# Patient Record
Sex: Male | Born: 1955
Health system: Southern US, Community
[De-identification: ages and names within clinical notes are randomized; demographics above are authoritative.]

## PROBLEM LIST (undated history)

## (undated) DIAGNOSIS — H9319 Tinnitus, unspecified ear: Secondary | ICD-10-CM

## (undated) DIAGNOSIS — K759 Inflammatory liver disease, unspecified: Secondary | ICD-10-CM

## (undated) DIAGNOSIS — C801 Malignant (primary) neoplasm, unspecified: Secondary | ICD-10-CM

## (undated) HISTORY — DX: Tinnitus, unspecified ear: H93.19

## (undated) HISTORY — DX: Malignant (primary) neoplasm, unspecified: C80.1

## (undated) HISTORY — DX: Inflammatory liver disease, unspecified: K75.9

---
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
## 2006-11-23 HISTORY — PX: HERNIA REPAIR: SHX51

## 2009-11-23 HISTORY — PX: COLONOSCOPY: SHX174

## 2013-08-28 ENCOUNTER — Other Ambulatory Visit: Payer: Self-pay | Admitting: *Deleted

## 2013-08-28 DIAGNOSIS — R944 Abnormal results of kidney function studies: Secondary | ICD-10-CM

## 2013-09-05 ENCOUNTER — Ambulatory Visit
Admission: RE | Admit: 2013-09-05 | Discharge: 2013-09-05 | Disposition: A | Discharge status: Home / Self Care | Payer: 59 | Source: Ambulatory Visit | Attending: *Deleted | Admitting: *Deleted

## 2013-09-05 DIAGNOSIS — R944 Abnormal results of kidney function studies: Secondary | ICD-10-CM

## 2014-07-31 DIAGNOSIS — M19039 Primary osteoarthritis, unspecified wrist: Secondary | ICD-10-CM | POA: Insufficient documentation

## 2014-07-31 DIAGNOSIS — K759 Inflammatory liver disease, unspecified: Secondary | ICD-10-CM | POA: Insufficient documentation

## 2014-07-31 DIAGNOSIS — H905 Unspecified sensorineural hearing loss: Secondary | ICD-10-CM | POA: Insufficient documentation

## 2014-07-31 DIAGNOSIS — H9313 Tinnitus, bilateral: Secondary | ICD-10-CM | POA: Insufficient documentation

## 2014-10-02 ENCOUNTER — Ambulatory Visit: Payer: Self-pay | Admitting: Internal Medicine

## 2014-10-17 ENCOUNTER — Ambulatory Visit (INDEPENDENT_AMBULATORY_CARE_PROVIDER_SITE_OTHER): Payer: PRIVATE HEALTH INSURANCE | Admitting: Internal Medicine

## 2014-10-17 ENCOUNTER — Encounter: Payer: Self-pay | Admitting: Internal Medicine

## 2014-10-17 VITALS — BP 124/76 | HR 66 | Temp 98.3°F | Ht 73.0 in | Wt 180.0 lb

## 2014-10-17 DIAGNOSIS — Z0000 Encounter for general adult medical examination without abnormal findings: Secondary | ICD-10-CM | POA: Insufficient documentation

## 2014-10-17 DIAGNOSIS — Z125 Encounter for screening for malignant neoplasm of prostate: Secondary | ICD-10-CM

## 2014-10-17 LAB — BASIC METABOLIC PANEL
BUN: 20 mg/dL (ref 6–23)
CO2: 26 mEq/L (ref 19–32)
Calcium: 10.1 mg/dL (ref 8.4–10.5)
Chloride: 104 mEq/L (ref 96–112)
Creat: 1.15 mg/dL (ref 0.50–1.35)
Glucose, Bld: 85 mg/dL (ref 70–99)
Potassium: 4.3 mEq/L (ref 3.5–5.3)
Sodium: 140 mEq/L (ref 135–145)

## 2014-10-17 LAB — CBC WITH DIFFERENTIAL/PLATELET
Basophils Absolute: 0.1 10*3/uL (ref 0.0–0.1)
Basophils Relative: 1 % (ref 0–1)
Eosinophils Absolute: 0.2 10*3/uL (ref 0.0–0.7)
Eosinophils Relative: 3 % (ref 0–5)
HCT: 42.5 % (ref 39.0–52.0)
Hemoglobin: 14.3 g/dL (ref 13.0–17.0)
Lymphocytes Relative: 41 % (ref 12–46)
Lymphs Abs: 2.9 10*3/uL (ref 0.7–4.0)
MCH: 31.4 pg (ref 26.0–34.0)
MCHC: 33.6 g/dL (ref 30.0–36.0)
MCV: 93.2 fL (ref 78.0–100.0)
MPV: 9.9 fL (ref 9.4–12.4)
Monocytes Absolute: 0.6 10*3/uL (ref 0.1–1.0)
Monocytes Relative: 8 % (ref 3–12)
Neutro Abs: 3.3 10*3/uL (ref 1.7–7.7)
Neutrophils Relative %: 47 % (ref 43–77)
Platelets: 204 10*3/uL (ref 150–400)
RBC: 4.56 MIL/uL (ref 4.22–5.81)
RDW: 13.2 % (ref 11.5–15.5)
WBC: 7.1 10*3/uL (ref 4.0–10.5)

## 2014-10-17 LAB — LIPID PANEL
Cholesterol: 163 mg/dL (ref 0–200)
HDL: 52 mg/dL (ref >39)
LDL Cholesterol: 90 mg/dL (ref 0–99)
Total CHOL/HDL Ratio: 3.1 Ratio
Triglycerides: 107 mg/dL (ref <150)
VLDL: 21 mg/dL (ref 0–40)

## 2014-10-17 LAB — HEPATIC FUNCTION PANEL
ALT: 27 U/L (ref 0–53)
AST: 27 U/L (ref 0–37)
Albumin: 4.6 g/dL (ref 3.5–5.2)
Alkaline Phosphatase: 65 U/L (ref 39–117)
Bilirubin, Direct: 0.1 mg/dL (ref 0.0–0.3)
Indirect Bilirubin: 0.3 mg/dL (ref 0.2–1.2)
Total Bilirubin: 0.4 mg/dL (ref 0.2–1.2)
Total Protein: 7.3 g/dL (ref 6.0–8.3)

## 2014-10-17 LAB — TSH: TSH: 1.569 u[IU]/mL (ref 0.350–4.500)

## 2014-10-17 NOTE — Assessment & Plan Note (Signed)
Reviewed adult health maintenance protocols.  Patient completed colonoscopy in 2011. Obtain copy of colonoscopy.  Patient has family history of metastatic prostate cancer. His father was diagnosed at age 60. His digital rectal exam was unremarkable. Monitor PSA.    He also has family history of coronary artery disease.  Obtain screening blood work.  He exercises on a regular basis.  He declines influenza vaccine.

## 2014-10-17 NOTE — Progress Notes (Signed)
Subjective:    Patient ID: Richard Mosley, male    DOB: Aug 24, 1956, 58 y.o.   MRN: [id number]  HPI  58 year old white male presents for routine new patient physical.  Patient previously followed by [facility].  Patient denies history of chronic illness. Patient reports remote history of transient hepatitis after receiving blood transfusion 1957. Patient suffered traumatic injury as a child which resulted in liver laceration.  His father diagnosed with prostate cancer (metastatic) at age 65. Patient reports he has been screened for prostate cancer yearly. His previous PSA normals reported normal.  Last PSA completed in 08/24/2013-1.290.  Patient reports he completed a colonoscopy in 2011. It was unremarkable. Follow-up colonoscopy recommended in 10 years.   Review of Systems  Constitutional: Negative for activity change, appetite change and unexpected weight change.  Eyes: Negative for visual disturbance.  Respiratory: Negative for cough, chest tightness and shortness of breath.   Cardiovascular: Negative for chest pain.  Genitourinary: Negative for difficulty urinating.  Neurological: Negative for headaches.  Gastrointestinal: Negative for abdominal pain, heartburn melena or hematochezia Psych: Negative for depression or anxiety Skin:  He was last evaluated by dermatologist 3 years ago        Past Medical History  Diagnosis Date  . Cancer     skin cancer (basal cell)  . Hepatitis     Transient - after blood transfusion  . Tinnitus     History   Social History  . Marital Status: Married    Spouse Name: N/A    Number of Children: N/A  . Years of Education: N/A   Occupational History  . 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 Smoking status: Former Smoker -- 0.50 packs/day for 10 years    Types: Cigars, Cigarettes  . Smokeless tobacco: Never Used     Comment: 1 cigar per week  . Alcohol Use: 4.2 oz/week    7 Cans of beer per week       Comment: daily  . Drug Use: No  . Sexual Activity: Not on file   Other Topics Concern  . Not on file   Social History Narrative   Married (2nd marriage)   1 step daughter   Former smoker   Quit smoking 30 years ago   Smoked half pack per day for 10 years   Current - occasionally smokes cigars    Past Surgical History  Procedure Laterality Date  . Hernia repair Bilateral 2008  . Liver laceration  1957  . Colonoscopy  2011    Normal exam    Family History  Problem Relation Age of Onset  . Stroke Mother   . Hypertension Mother   . Hypertension Father   . Heart disease Father   . Cancer Father 16    Metastatic prostate cancer   . Heart disease Brother     both brothers    Not on File  No current outpatient prescriptions on file prior to visit.   No current facility-administered medications on file prior to visit.    BP 124/76 mmHg  Pulse 66  Temp(Src) 98.3 F (36.8 C) (Oral)  Ht 6\' 1"  (1.854 m)  Wt 180 lb (81.647 kg)  BMI 23.75 kg/m2        Objective:   Physical Exam  Constitutional: He is oriented to person, place, and time. He appears well-developed and well-nourished. No distress.  HENT:  Head: Normocephalic and atraumatic.  Right Ear:  External ear normal.  Left Ear: External ear normal.  Mouth/Throat: Oropharynx is clear and moist.  Eyes: Conjunctivae and EOM are normal. Pupils are equal, round, and reactive to light.  Neck: Neck supple.  Cardiovascular: Normal rate, regular rhythm and normal heart sounds.   No murmur heard. slightly diminished pedis dorsalis pulse in right foot  Pulmonary/Chest: Effort normal and breath sounds normal. He has no wheezes.  Abdominal: Soft. Bowel sounds are normal. He exhibits no mass. There is no tenderness.  Genitourinary: Prostate normal and penis normal. Guaiac negative stool.  External hemorrhoids  Musculoskeletal: He exhibits no edema.  Lymphadenopathy:    He has no cervical adenopathy.   Neurological: He is alert and oriented to person, place, and time. No cranial nerve deficit. Coordination normal.  Psychiatric: He has a normal mood and affect. His behavior is normal.          Assessment & Plan:

## 2014-10-18 LAB — HIGH SENSITIVITY CRP: CRP, High Sensitivity: 0.2 mg/L

## 2014-10-18 LAB — PSA: PSA: 2.02 ng/mL (ref <=4.00)

## 2014-10-19 ENCOUNTER — Encounter: Payer: Self-pay | Admitting: Internal Medicine

## 2014-11-02 ENCOUNTER — Encounter: Payer: Self-pay | Admitting: Internal Medicine

## 2014-11-12 ENCOUNTER — Ambulatory Visit: Payer: Self-pay | Admitting: Internal Medicine

## 2014-12-05 ENCOUNTER — Ambulatory Visit: Payer: Self-pay | Admitting: Internal Medicine

## 2018-07-11 ENCOUNTER — Ambulatory Visit
Admission: RE | Admit: 2018-07-11 | Discharge: 2018-07-11 | Disposition: A | Discharge status: Home / Self Care | Payer: BLUE CROSS/BLUE SHIELD | Source: Ambulatory Visit | Attending: Internal Medicine | Admitting: Internal Medicine

## 2018-07-11 ENCOUNTER — Other Ambulatory Visit: Payer: Self-pay | Admitting: Internal Medicine

## 2018-07-11 DIAGNOSIS — M79671 Pain in right foot: Secondary | ICD-10-CM

## 2018-08-26 ENCOUNTER — Ambulatory Visit: Payer: BLUE CROSS/BLUE SHIELD | Admitting: Podiatry

## 2018-08-26 ENCOUNTER — Encounter

## 2019-11-27 DIAGNOSIS — R69 Illness, unspecified: Secondary | ICD-10-CM | POA: Diagnosis not present

## 2019-12-18 DIAGNOSIS — C44712 Basal cell carcinoma of skin of right lower limb, including hip: Secondary | ICD-10-CM | POA: Diagnosis not present

## 2020-01-22 DIAGNOSIS — R202 Paresthesia of skin: Secondary | ICD-10-CM | POA: Diagnosis not present

## 2020-01-26 ENCOUNTER — Ambulatory Visit: Payer: PRIVATE HEALTH INSURANCE | Admitting: Podiatry

## 2020-02-13 ENCOUNTER — Ambulatory Visit (INDEPENDENT_AMBULATORY_CARE_PROVIDER_SITE_OTHER): Payer: Medicare HMO | Admitting: Podiatry

## 2020-02-13 ENCOUNTER — Ambulatory Visit (INDEPENDENT_AMBULATORY_CARE_PROVIDER_SITE_OTHER): Payer: Medicare HMO

## 2020-02-13 ENCOUNTER — Other Ambulatory Visit: Payer: Self-pay

## 2020-02-13 ENCOUNTER — Encounter: Payer: Self-pay | Admitting: Podiatry

## 2020-02-13 VITALS — BP 141/79 | HR 64 | Temp 97.1°F

## 2020-02-13 DIAGNOSIS — M778 Other enthesopathies, not elsewhere classified: Secondary | ICD-10-CM

## 2020-02-13 DIAGNOSIS — M7742 Metatarsalgia, left foot: Secondary | ICD-10-CM

## 2020-02-13 DIAGNOSIS — M7741 Metatarsalgia, right foot: Secondary | ICD-10-CM | POA: Diagnosis not present

## 2020-02-13 MED ORDER — MELOXICAM 15 MG PO TABS: 15.0000 mg | ORAL_TABLET | Freq: Every day | ORAL | 3 refills | Status: AC

## 2020-02-13 NOTE — Progress Notes (Signed)
Subjective:  Patient ID: Richard Mosley, male    DOB: June 30, 1956,  MRN: [id number] HPI Chief Complaint  Patient presents with  . Foot Pain    Plantar forefoot bilateral - feels like walking on "bumps", [place] trip and golfed-noticed it worsened, also work PT jobs standing the whole time, tried new shoes  . New Patient (Initial Visit)    64 y.o. male presents with the above complaint.   ROS: Denies fever chills nausea vomiting muscle aches pains calf pain back pain chest pain shortness of breath.  Past Medical History:  Diagnosis Date  . Cancer ([place])    skin cancer (basal cell)  . Hepatitis    Transient - after blood transfusion  . Tinnitus    Past Surgical History:  Procedure Laterality Date  . COLONOSCOPY  2011   Normal exam  . HERNIA REPAIR Bilateral 2008  . Liver laceration  1957    Current Outpatient Medications:  .  SILDENAFIL CITRATE PO, Take by mouth., Disp: , Rfl:  .  meloxicam (MOBIC) 15 MG tablet, Take 1 tablet (15 mg total) by mouth daily., Disp: 30 tablet, Rfl: 3 .  Multiple Vitamin (MULTIVITAMIN) tablet, Take 1 tablet by mouth daily., Disp: , Rfl:  .  Omega-3 Fatty Acids (FISH OIL) 1000 MG CAPS, Take 1 capsule by mouth daily., Disp: , Rfl:  .  saw palmetto 160 MG capsule, Take 160 mg by mouth daily., Disp: , Rfl:  .  vitamin C (ASCORBIC ACID) 500 MG tablet, Take 500 mg by mouth daily., Disp: , Rfl:   No Known Allergies Review of Systems Objective:   Vitals:   02/13/20 0838  BP: (!) 141/79  Pulse: 64  Temp: (!) 97.1 F (36.2 C)    General: Well developed, nourished, in no acute distress, alert and oriented x3   Dermatological: Skin is warm, dry and supple bilateral. Nails x 10 are well maintained; remaining integument appears unremarkable at this time. There are no open sores, no preulcerative lesions, no rash or signs of infection present.  Vascular: Dorsalis Pedis artery and Posterior Tibial artery pedal pulses are 2/4 bilateral with immedate  capillary fill time. Pedal hair growth present. No varicosities and no lower extremity edema present bilateral.   Neruologic: Grossly intact via light touch bilateral. Vibratory intact via tuning fork bilateral. Protective threshold with Semmes Wienstein monofilament intact to all pedal sites bilateral. Patellar and Achilles deep tendon reflexes 2+ bilateral. No Babinski or clonus noted bilateral.   Musculoskeletal: No gross boney pedal deformities bilateral. No pain, crepitus, or limitation noted with foot and ankle range of motion bilateral. Muscular strength 5/5 in all groups tested bilateral.  Slightly elongated second and third metatarsals of the bilateral foot with mild flexible hammertoe deformities.  No significant pain on range of motion or on end range of motion some tenderness at the metatarsophalangeal joints plantarly.  Gait: Unassisted, Nonantalgic.    Radiographs:  None taken  Assessment & Plan:   Assessment: Metatarsalgia capsulitis second and third metatarsophalangeal joints bilaterally.  Cannot rule out a neuropathy or neuritis.  Plan: Discussed etiology pathology conservative versus surgical therapies at this point in time went ahead and recommended meloxicam 15 mg 1 p.o. daily I also started him on Voltaren gel over-the-counter.  We discussed appropriate stiff soled shoes and shoe gear modifications.  We did discuss the possibility of orthotics and injections.  I will follow-up with him in 1 month if he has not improved with conservative treatment.     [name]  T. [place], [place]

## 2020-02-29 DIAGNOSIS — L814 Other melanin hyperpigmentation: Secondary | ICD-10-CM | POA: Diagnosis not present

## 2020-02-29 DIAGNOSIS — L905 Scar conditions and fibrosis of skin: Secondary | ICD-10-CM | POA: Diagnosis not present

## 2020-02-29 DIAGNOSIS — D229 Melanocytic nevi, unspecified: Secondary | ICD-10-CM | POA: Diagnosis not present

## 2020-02-29 DIAGNOSIS — Z85828 Personal history of other malignant neoplasm of skin: Secondary | ICD-10-CM | POA: Diagnosis not present

## 2020-02-29 DIAGNOSIS — L819 Disorder of pigmentation, unspecified: Secondary | ICD-10-CM | POA: Diagnosis not present

## 2020-02-29 DIAGNOSIS — L821 Other seborrheic keratosis: Secondary | ICD-10-CM | POA: Diagnosis not present

## 2020-02-29 DIAGNOSIS — L570 Actinic keratosis: Secondary | ICD-10-CM | POA: Diagnosis not present

## 2020-03-21 ENCOUNTER — Ambulatory Visit: Payer: Medicare HMO | Admitting: Podiatry

## 2020-04-02 ENCOUNTER — Ambulatory Visit: Payer: Medicare HMO | Admitting: Podiatry

## 2020-04-02 ENCOUNTER — Other Ambulatory Visit: Payer: Self-pay

## 2020-04-02 DIAGNOSIS — M7742 Metatarsalgia, left foot: Secondary | ICD-10-CM | POA: Diagnosis not present

## 2020-04-02 DIAGNOSIS — M7741 Metatarsalgia, right foot: Secondary | ICD-10-CM | POA: Diagnosis not present

## 2020-04-02 NOTE — Progress Notes (Signed)
He presents today states that is been improvement from last visit though he did not purchase the Voltaren gel and the meloxicam really did not do much for him and currently he does not want to splurge on the orthotics.  He states that he has been rolling a ball under his arch and stretching his toes.  He also feels an area of uncomfortable tenderness between the third and fourth metatarsophalangeal joints right over left.  Objective: Vital signs are stable alert oriented x3 ulcers are palpable.  Neurologic sensorium is intact he does have much decrease in pain on palpation and range of motion of the metatarsophalangeal joints particularly the lessers.  He also has some tenderness on palpation of the third interdigital space right over left.  Assessment: Resolving metatarsalgia capsulitis forefoot bilateral he does have some signs of neuroma bilateral but not significant enough to do anything about them at this time so we did once again discuss appropriate shoe gear with an solid soles which should help alleviate his symptoms.

## 2020-04-12 DIAGNOSIS — Z8249 Family history of ischemic heart disease and other diseases of the circulatory system: Secondary | ICD-10-CM | POA: Diagnosis not present

## 2020-04-12 DIAGNOSIS — R030 Elevated blood-pressure reading, without diagnosis of hypertension: Secondary | ICD-10-CM | POA: Diagnosis not present

## 2020-04-12 DIAGNOSIS — Z809 Family history of malignant neoplasm, unspecified: Secondary | ICD-10-CM | POA: Diagnosis not present

## 2020-04-12 DIAGNOSIS — Z85828 Personal history of other malignant neoplasm of skin: Secondary | ICD-10-CM | POA: Diagnosis not present

## 2020-04-12 DIAGNOSIS — Z87891 Personal history of nicotine dependence: Secondary | ICD-10-CM | POA: Diagnosis not present

## 2020-05-30 DIAGNOSIS — R6884 Jaw pain: Secondary | ICD-10-CM | POA: Diagnosis not present

## 2020-05-31 ENCOUNTER — Encounter: Payer: Self-pay | Admitting: Neurology

## 2020-07-02 DIAGNOSIS — Z131 Encounter for screening for diabetes mellitus: Secondary | ICD-10-CM | POA: Diagnosis not present

## 2020-07-02 DIAGNOSIS — Z0000 Encounter for general adult medical examination without abnormal findings: Secondary | ICD-10-CM | POA: Diagnosis not present

## 2020-07-02 DIAGNOSIS — E7800 Pure hypercholesterolemia, unspecified: Secondary | ICD-10-CM | POA: Diagnosis not present

## 2020-07-02 DIAGNOSIS — Z13220 Encounter for screening for lipoid disorders: Secondary | ICD-10-CM | POA: Diagnosis not present

## 2020-07-02 DIAGNOSIS — Z125 Encounter for screening for malignant neoplasm of prostate: Secondary | ICD-10-CM | POA: Diagnosis not present

## 2020-08-26 NOTE — Progress Notes (Signed)
NEUROLOGY CONSULTATION NOTE  Richard Mosley MRN: [id number] DOB: 1956-01-12  Referring provider: Seward Carol, MD Primary care provider: Seward Carol, MD  Reason for consult:  Left sided facial pain  HISTORY OF PRESENT ILLNESS: Richard Mosley is a 64 year old right-handed male who presents for left-sided facial pain and neuropathy.  History supplemented by referring provider's notes.  Over the past 5 years, he has experienced intermittent sharp paroxymal pain on the left side of his face.  He describes a severe pain in the left nasolabial fold that radiates up to the left eye.  No associated numbness or facial weakness.  It typically occurs in the winter but was also aggravated over the summer when he was in hot and dry weather out [place].  It is often triggered by cold air or touching his cheek.   When he has it, it usually occurs for 2 to 3 hours in the mornings.  He was evaluated by both a dentist and orthodontist who found no dental etiology.  He is concerned about trigeminal neuralgia.  He is currently without pain.  In February, he developed fairly sudden onset numbness in the feet.  When he stands, it feels like he is standing on bumps.  He may feel paresthesias in the feet with prolonged standing or wearing tight shoes or socks.  No low back pain or radiating pain down the legs.  It has not progressed since February.  He does not have diabetes.  TSH was normal.   06/26/2020 LABS:  CMP with Na 140, K 4.1, Cl 105, CO2 29, glucose 90, BUN 25, Cr 1.04, t bili 0.4, ALP 61, AST 21, ALT 18; TSH 2.02  PAST MEDICAL HISTORY: Past Medical History:  Diagnosis Date  . Cancer ([place])    skin cancer (basal cell)  . Hepatitis    Transient - after blood transfusion  . Tinnitus     PAST SURGICAL HISTORY: Past Surgical History:  Procedure Laterality Date  . COLONOSCOPY  2011   Normal exam  . HERNIA REPAIR Bilateral 2008  . Liver laceration  1957    MEDICATIONS: Current Outpatient Medications  on File Prior to Visit  Medication Sig Dispense Refill  . amoxicillin (AMOXIL) 500 MG capsule Take 500 mg by mouth 3 (three) times daily.    . meloxicam (MOBIC) 15 MG tablet Take 1 tablet (15 mg total) by mouth daily. 30 tablet 3  . Multiple Vitamin (MULTIVITAMIN) tablet Take 1 tablet by mouth daily.    . Omega-3 Fatty Acids (FISH OIL) 1000 MG CAPS Take 1 capsule by mouth daily.    . saw palmetto 160 MG capsule Take 160 mg by mouth daily.    Marland Kitchen SILDENAFIL CITRATE PO Take by mouth.    . vitamin C (ASCORBIC ACID) 500 MG tablet Take 500 mg by mouth daily.     No current facility-administered medications on file prior to visit.    ALLERGIES: No Known Allergies  FAMILY HISTORY: Family History  Problem Relation Age of Onset  . Stroke Mother   . Hypertension Mother   . Hypertension Father   . Heart disease Father   . Cancer Father 81       Metastatic prostate cancer   . Heart disease Brother        both brothers   SOCIAL HISTORY: Social History   Socioeconomic History  . Marital status: Married    Spouse name: Not on file  . Number of children: Not on file  . Years  of education: Not on file  . Highest education level: Not on file  Occupational History  . Occupation: Tree surgeon    Comment: Assisted Living  Tobacco Use  . Smoking status: Former Smoker    Packs/day: 0.50    Years: 10.00    Pack years: 5.00    Types: Cigars, Cigarettes  . Smokeless tobacco: Never Used  . Tobacco comment: 1 cigar per week  Substance and Sexual Activity  . Alcohol use: Yes    Alcohol/week: 7.0 standard drinks    Types: 7 Cans of beer per week    Comment: daily  . Drug use: No  . Sexual activity: Not on file  Other Topics Concern  . Not on file  Social History Narrative   Married (2nd marriage)   1 step daughter   Former smoker   Quit smoking 30 years ago   Smoked half pack per day for 10 years   Current - occasionally smokes cigars   Social Determinants of Health   Financial  Resource Strain:   . Difficulty of Paying Living Expenses: Not on file  Food Insecurity:   . Worried About Charity fundraiser in the Last Year: Not on file  . Ran Out of Food in the Last Year: Not on file  Transportation Needs:   . Lack of Transportation (Medical): Not on file  . Lack of Transportation (Non-Medical): Not on file  Physical Activity:   . Days of Exercise per Week: Not on file  . Minutes of Exercise per Session: Not on file  Stress:   . Feeling of Stress : Not on file  Social Connections:   . Frequency of Communication with Friends and Family: Not on file  . Frequency of Social Gatherings with Friends and Family: Not on file  . Attends Religious Services: Not on file  . Active Member of Clubs or Organizations: Not on file  . Attends Archivist Meetings: Not on file  . Marital Status: Not on file  Intimate Partner Violence:   . Fear of Current or Ex-Partner: Not on file  . Emotionally Abused: Not on file  . Physically Abused: Not on file  . Sexually Abused: Not on file   PHYSICAL EXAM: Blood pressure 127/67, pulse 73, height 6\' 2"  (1.88 m), weight 184 lb 9.6 oz (83.7 kg), SpO2 100 %. General: No acute distress.  Patient appears well-groomed.   Head:  Normocephalic/atraumatic Eyes:  fundi examined but not visualized Neck: supple, no paraspinal tenderness, full range of motion Back: No paraspinal tenderness Heart: regular rate and rhythm Lungs: Clear to auscultation bilaterally. Vascular: No carotid bruits. Neurological Exam: Mental status: alert and oriented to person, place, and time, recent and remote memory intact, fund of knowledge intact, attention and concentration intact, speech fluent and not dysarthric, language intact. Cranial nerves: CN I: not tested CN II: pupils equal, round and reactive to light, visual fields intact CN III, IV, VI:  full range of motion, no nystagmus, no ptosis CN V: facial sensation intact CN VII: upper and lower face  symmetric CN VIII: hearing intact CN IX, X: gag intact, uvula midline CN XI: sternocleidomastoid and trapezius muscles intact CN XII: tongue midline Bulk & Tone: normal, no fasciculations. Motor:  5/5 throughout  Sensation:  Pinprick sensation mildly reduced in feet and vibratory sensation noticeably reduced in toes. Deep Tendon Reflexes:  2+ throughout, toes downgoing.   Finger to nose testing:  Without dysmetria.   Heel to shin:  Without  dysmetria.   Gait:  Normal station and stride.  Romberg negative.  IMPRESSION: 1.  Left-sided trigeminal neuralgia.  Classic presentation without red flags.  Therefore, will defer workup and begin treatment. 2.  Polyneuropathy  PLAN: 1.  In early December, he will contact me and we will empirically start oxcarbazepine as it occurs in winter. 2.  We will perform a neuropathy workup:  -  Check blood work:  ANA, sed rate, CRP, SSA/SSB antibodies, B12, ACE, SPEP/IFE  -  NCV-EMG of lower extremities 3.  Follow up in 4 to 6 months.  Thank you for allowing me to take part in the care of this patient.  Metta Clines, DO  CC: Seward Carol, MD

## 2020-08-27 ENCOUNTER — Ambulatory Visit: Payer: Medicare HMO | Admitting: Neurology

## 2020-08-27 ENCOUNTER — Encounter: Payer: Self-pay | Admitting: Neurology

## 2020-08-27 ENCOUNTER — Other Ambulatory Visit: Payer: Medicare HMO

## 2020-08-27 ENCOUNTER — Other Ambulatory Visit: Payer: Self-pay

## 2020-08-27 VITALS — BP 127/67 | HR 73 | Ht 74.0 in | Wt 184.6 lb

## 2020-08-27 DIAGNOSIS — G629 Polyneuropathy, unspecified: Secondary | ICD-10-CM

## 2020-08-27 DIAGNOSIS — G500 Trigeminal neuralgia: Secondary | ICD-10-CM

## 2020-08-27 LAB — VITAMIN B12: Vitamin B-12: 557 pg/mL (ref 211–911)

## 2020-08-27 LAB — SEDIMENTATION RATE: Sed Rate: 6 mm/hr (ref 0–20)

## 2020-08-27 LAB — C-REACTIVE PROTEIN: CRP: <1 mg/dL (ref 0.5–20.0)

## 2020-08-27 NOTE — Patient Instructions (Signed)
1.  Contact me in the beginning of December and we can start a medication for trigeminal neuralgia (oxcarbazepine). 2.  We will perform a workup for neuropathy:  -  Check blood work:  ANA, sed rate, CRP, SSA/SSB antibodies, B12, ACE, SPEP/IFE  -  Nerve conduction study of lower extremities   3.  Follow up in 4 to 6 months

## 2020-08-29 DIAGNOSIS — D229 Melanocytic nevi, unspecified: Secondary | ICD-10-CM | POA: Diagnosis not present

## 2020-08-29 DIAGNOSIS — D485 Neoplasm of uncertain behavior of skin: Secondary | ICD-10-CM | POA: Diagnosis not present

## 2020-08-29 DIAGNOSIS — L905 Scar conditions and fibrosis of skin: Secondary | ICD-10-CM | POA: Diagnosis not present

## 2020-08-29 DIAGNOSIS — L570 Actinic keratosis: Secondary | ICD-10-CM | POA: Diagnosis not present

## 2020-08-29 DIAGNOSIS — L814 Other melanin hyperpigmentation: Secondary | ICD-10-CM | POA: Diagnosis not present

## 2020-08-29 DIAGNOSIS — Z85828 Personal history of other malignant neoplasm of skin: Secondary | ICD-10-CM | POA: Diagnosis not present

## 2020-08-29 DIAGNOSIS — D1801 Hemangioma of skin and subcutaneous tissue: Secondary | ICD-10-CM | POA: Diagnosis not present

## 2020-08-29 DIAGNOSIS — L819 Disorder of pigmentation, unspecified: Secondary | ICD-10-CM | POA: Diagnosis not present

## 2020-08-29 DIAGNOSIS — L821 Other seborrheic keratosis: Secondary | ICD-10-CM | POA: Diagnosis not present

## 2020-08-29 DIAGNOSIS — C4441 Basal cell carcinoma of skin of scalp and neck: Secondary | ICD-10-CM | POA: Diagnosis not present

## 2020-09-03 ENCOUNTER — Telehealth: Payer: Self-pay | Admitting: Neurology

## 2020-09-03 LAB — ANGIOTENSIN CONVERTING ENZYME: Angiotensin-Converting Enzyme: 22 U/L (ref 9–67)

## 2020-09-03 LAB — IMMUNOFIXATION ELECTROPHORESIS
IgG (Immunoglobin G), Serum: 1202 mg/dL (ref 600–1540)
IgM, Serum: 73 mg/dL (ref 50–300)
Immunofix Electr Int: NOT DETECTED
Immunoglobulin A: 176 mg/dL (ref 70–320)

## 2020-09-03 LAB — PROTEIN ELECTROPHORESIS, SERUM
Albumin ELP: 4.5 g/dL (ref 3.8–4.8)
Alpha 1: 0.2 g/dL (ref 0.2–0.3)
Alpha 2: 0.5 g/dL (ref 0.5–0.9)
Beta 2: 0.3 g/dL (ref 0.2–0.5)
Beta Globulin: 0.4 g/dL (ref 0.4–0.6)
Gamma Globulin: 1.1 g/dL (ref 0.8–1.7)
Total Protein: 7 g/dL (ref 6.1–8.1)

## 2020-09-03 LAB — SJOGREN'S SYNDROME ANTIBODS(SSA + SSB)
SSA (Ro) (ENA) Antibody, IgG: <1 AI
SSB (La) (ENA) Antibody, IgG: <1 AI

## 2020-09-03 LAB — ANTI-NUCLEAR AB-TITER (ANA TITER): ANA Titer 1: 1:40 {titer} — ABNORMAL HIGH

## 2020-09-03 LAB — ANA: Anti Nuclear Antibody (ANA): POSITIVE — AB

## 2020-09-03 NOTE — Telephone Encounter (Signed)
Patient left voicemail returning a call. He thinks it was about lab results. Please call.

## 2020-09-03 NOTE — Telephone Encounter (Signed)
LMOVM

## 2020-09-04 NOTE — Telephone Encounter (Signed)
Pt lab results advised.

## 2020-10-02 ENCOUNTER — Encounter: Payer: Medicare HMO | Admitting: Neurology

## 2020-10-09 ENCOUNTER — Other Ambulatory Visit: Payer: Self-pay

## 2020-10-09 ENCOUNTER — Ambulatory Visit: Payer: Medicare HMO | Admitting: Neurology

## 2020-10-09 DIAGNOSIS — G629 Polyneuropathy, unspecified: Secondary | ICD-10-CM | POA: Diagnosis not present

## 2020-10-09 NOTE — Procedures (Signed)
[facility]  [address]  [place], [place] [address] Tel: [phone] Fax:  [phone] Test Date:  10/09/2020  Patient: Richard Mosley DOB: 06/27/1956 Physician: Narda Amber, DO  Sex: Male Height: 6\' 2"  Ref Phys: Metta Clines, D.O.  ID#: [id number]   Technician:    Patient Complaints: This is a 64 year old man referred for evaluation of bilateral feet numbness.  NCV & EMG Findings: Extensive electrodiagnostic testing of the right lower extremity and additional studies of the left shows:  1. Bilateral sural and superficial peroneal sensory responses are within normal limits. 2. Bilateral peroneal motor responses are absent at the extensor digitorum brevis, and normal at the tibialis anterior.  Bilateral tibial motor responses are within normal limits. 3. Bilateral tibial H reflex studies are within normal limits. 4. There is no evidence of active or chronic motor axonal loss changes affecting any of the tested muscles.  Motor unit configuration and recruitment pattern is within normal limits.  Impression: This is a normal study of the lower extremities.  In particular, there is no evidence of a large fiber sensorimotor polyneuropathy or lumbosacral radiculopathy.   ___________________________ Narda Amber, DO    Nerve Conduction Studies Anti Sensory Summary Table   Stim Site NR Peak (ms) Norm Peak (ms) P-T Amp (V) Norm P-T Amp  Left Sup Peroneal Anti Sensory (Ant Lat Mall)  32C  12 cm    3.0 <4.6 4.4 >3  Right Sup Peroneal Anti Sensory (Ant Lat Mall)  32C  12 cm    2.2 <4.6 5.1 >3  Left Sural Anti Sensory (Lat Mall)  32C  Calf    3.4 <4.6 11.1 >3  Right Sural Anti Sensory (Lat Mall)  32C  Calf    2.8 <4.6 9.8 >3   Motor Summary Table   Stim Site NR Onset (ms) Norm Onset (ms) O-P Amp (mV) Norm O-P Amp Site1 Site2 Delta-0 (ms) Dist (cm) Vel (m/s) Norm Vel (m/s)  Left Peroneal Motor (Ext Dig Brev)  32C  Ankle NR  <6.0  >2.5 B Fib Ankle  0.0   >40  B Fib NR     Poplt B Fib  0.0  >40  Poplt NR            Right Peroneal Motor (Ext Dig Brev)  32C  Ankle NR  <6.0  >2.5 B Fib Ankle  0.0  >40  B Fib NR     Poplt B Fib  0.0  >40  Poplt NR            Left Peroneal TA Motor (Tib Ant)  32C  Fib Head    2.5 <4.5 5.1 >3 Poplit Fib Head 2.0 10.0 50 >40  Poplit    4.5  4.7         Right Peroneal TA Motor (Tib Ant)  32C  Fib Head    2.3 <4.5 4.4 >3 Poplit Fib Head 1.9 10.0 53 >40  Poplit    4.2  4.4         Left Tibial Motor (Abd Hall Brev)  32C  Ankle    5.2 <6.0 10.6 >4 Knee Ankle 9.3 44.0 47 >40  Knee    14.5  7.7         Right Tibial Motor (Abd Hall Brev)  32C  Ankle    4.4 <6.0 10.5 >4 Knee Ankle 9.8 46.0 47 >40  Knee    14.2  8.0  H Reflex Studies   NR H-Lat (ms) Lat Norm (ms) L-R H-Lat (ms)  Left Tibial (Gastroc)  32C     34.42 <35 0.00  Right Tibial (Gastroc)  32C     34.42 <35 0.00   EMG   Side Muscle Ins Act Fibs Psw Fasc Number Recrt Dur Dur. Amp Amp. Poly Poly. Comment  Right AntTibialis Nml Nml Nml Nml Nml Nml Nml Nml Nml Nml Nml Nml N/A  Right Gastroc Nml Nml Nml Nml Nml Nml Nml Nml Nml Nml Nml Nml N/A  Right Flex Dig Long Nml Nml Nml Nml Nml Nml Nml Nml Nml Nml Nml Nml N/A  Right RectFemoris Nml Nml Nml Nml Nml Nml Nml Nml Nml Nml Nml Nml N/A  Right GluteusMed Nml Nml Nml Nml Nml Nml Nml Nml Nml Nml Nml Nml N/A  Right BicepsFemS Nml Nml Nml Nml Nml Nml Nml Nml Nml Nml Nml Nml N/A  Left BicepsFemS Nml Nml Nml Nml Nml Nml Nml Nml Nml Nml Nml Nml N/A  Left AntTibialis Nml Nml Nml Nml Nml Nml Nml Nml Nml Nml Nml Nml N/A  Left Gastroc Nml Nml Nml Nml Nml Nml Nml Nml Nml Nml Nml Nml N/A  Left Flex Dig Long Nml Nml Nml Nml Nml Nml Nml Nml Nml Nml Nml Nml N/A  Left RectFemoris Nml Nml Nml Nml Nml Nml Nml Nml Nml Nml Nml Nml N/A  Left GluteusMed Nml Nml Nml Nml Nml Nml Nml Nml Nml Nml Nml Nml N/A      Waveforms:

## 2020-10-21 DIAGNOSIS — Z1159 Encounter for screening for other viral diseases: Secondary | ICD-10-CM | POA: Diagnosis not present

## 2020-10-23 DIAGNOSIS — Z1211 Encounter for screening for malignant neoplasm of colon: Secondary | ICD-10-CM | POA: Diagnosis not present

## 2020-10-23 DIAGNOSIS — D120 Benign neoplasm of cecum: Secondary | ICD-10-CM | POA: Diagnosis not present

## 2020-10-25 DIAGNOSIS — D120 Benign neoplasm of cecum: Secondary | ICD-10-CM | POA: Diagnosis not present

## 2020-11-25 DIAGNOSIS — C4441 Basal cell carcinoma of skin of scalp and neck: Secondary | ICD-10-CM | POA: Diagnosis not present

## 2020-12-27 DIAGNOSIS — S42142A Displaced fracture of glenoid cavity of scapula, left shoulder, initial encounter for closed fracture: Secondary | ICD-10-CM | POA: Diagnosis not present

## 2020-12-27 DIAGNOSIS — M25312 Other instability, left shoulder: Secondary | ICD-10-CM | POA: Diagnosis not present

## 2021-01-06 DIAGNOSIS — M25512 Pain in left shoulder: Secondary | ICD-10-CM | POA: Diagnosis not present

## 2021-01-08 DIAGNOSIS — M25312 Other instability, left shoulder: Secondary | ICD-10-CM | POA: Diagnosis not present

## 2021-01-09 DIAGNOSIS — M7592 Shoulder lesion, unspecified, left shoulder: Secondary | ICD-10-CM | POA: Diagnosis not present

## 2021-01-09 DIAGNOSIS — S43015D Anterior dislocation of left humerus, subsequent encounter: Secondary | ICD-10-CM | POA: Diagnosis not present

## 2021-01-13 NOTE — Progress Notes (Deleted)
NEUROLOGY FOLLOW UP OFFICE NOTE  Richard Mosley [id number]  Assessment/Plan:   1.  Left-sided trigeminal neuralgia 2.  Polyneuropathy - ***  Subjective:  Richard Mosley is a 65 year old right-handed male who presents for left-sided trigeminal neuralgia and polyneuropathy.  UPDATE: ***  Workup for neuropathy: 08/27/2020 LABS:  ANA positive 1:40 titer, sed rate 6, CRP negative, B12 557, ACE 22, SPEP/IFE negative for monoclonal antibody, SSA/SSB antibodies negative 10/09/2020 NCV-EMG lower extremities:  normal  HISTORY: Over the past 5 years, he has experienced intermittent sharp paroxymal pain on the left side of his face.  He describes a severe pain in the left nasolabial fold that radiates up to the left eye.  No associated numbness or facial weakness.  It typically occurs in the winter but was also aggravated over the summer when he was in hot and dry weather out [place].  It is often triggered by cold air or touching his cheek.   When he has it, it usually occurs for 2 to 3 hours in the mornings.  He was evaluated by both a dentist and orthodontist who found no dental etiology.  He is concerned about trigeminal neuralgia.  He is currently without pain.  In February, he developed fairly sudden onset numbness in the feet.  When he stands, it feels like he is standing on bumps.  He may feel paresthesias in the feet with prolonged standing or wearing tight shoes or socks.  No low back pain or radiating pain down the legs.  It has not progressed since February.  He does not have diabetes.  TSH was normal.  PAST MEDICAL HISTORY: Past Medical History:  Diagnosis Date  . Cancer ([place])    skin cancer (basal cell)  . Hepatitis    Transient - after blood transfusion  . Tinnitus     MEDICATIONS: Current Outpatient Medications on File Prior to Visit  Medication Sig Dispense Refill  . amoxicillin (AMOXIL) 500 MG capsule Take 500 mg by mouth 3 (three) times daily. (Patient not taking: Reported  on 08/27/2020)    . meloxicam (MOBIC) 15 MG tablet Take 1 tablet (15 mg total) by mouth daily. (Patient not taking: Reported on 08/27/2020) 30 tablet 3  . Multiple Vitamin (MULTIVITAMIN) tablet Take 1 tablet by mouth daily.    . Omega-3 Fatty Acids (FISH OIL) 1000 MG CAPS Take 1 capsule by mouth daily.    . saw palmetto 160 MG capsule Take 160 mg by mouth daily. (Patient not taking: Reported on 08/27/2020)    . SILDENAFIL CITRATE PO Take by mouth.    . vitamin C (ASCORBIC ACID) 500 MG tablet Take 500 mg by mouth daily.     No current facility-administered medications on file prior to visit.    ALLERGIES: No Known Allergies  FAMILY HISTORY: Family History  Problem Relation Age of Onset  . Stroke Mother   . Hypertension Mother   . Hypertension Father   . Heart disease Father   . Cancer Father 49       Metastatic prostate cancer   . Heart disease Brother        both brothers   ***.  SOCIAL HISTORY: Social History   Socioeconomic History  . Marital status: Married    Spouse name: Not on file  . Number of children: Not on file  . Years of education: Not on file  . Highest education level: Not on file  Occupational History  . Occupation: Tree surgeon    Comment:  Assisted Living  Tobacco Use  . Smoking status: Former Smoker    Packs/day: 0.50    Years: 10.00    Pack years: 5.00    Types: Cigars, Cigarettes  . Smokeless tobacco: Never Used  . Tobacco comment: 1 cigar per week  Substance and Sexual Activity  . Alcohol use: Yes    Alcohol/week: 7.0 standard drinks    Types: 7 Cans of beer per week    Comment: daily  . Drug use: No  . Sexual activity: Not on file  Other Topics Concern  . Not on file  Social History Narrative   Married (2nd marriage)   1 step daughter   Former smoker   Quit smoking 30 years ago   Smoked half pack per day for 10 years   Current - occasionally smokes cigars   Social Determinants of Radio broadcast assistant Strain: Not on file   Food Insecurity: Not on file  Transportation Needs: Not on file  Physical Activity: Not on file  Stress: Not on file  Social Connections: Not on file  Intimate Partner Violence: Not on file     Objective:  *** General: No acute distress.  Patient appears ***-groomed.   Head:  Normocephalic/atraumatic Eyes:  Fundi examined but not visualized Neck: supple, no paraspinal tenderness, full range of motion Heart:  Regular rate and rhythm Lungs:  Clear to auscultation bilaterally Back: No paraspinal tenderness Neurological Exam: alert and oriented to person, place, and time. Attention span and concentration intact, recent and remote memory intact, fund of knowledge intact.  Speech fluent and not dysarthric, language intact.  CN II-XII intact. Bulk and tone normal, muscle strength 5/5 throughout.  Sensation to light touch, temperature and vibration intact.  Deep tendon reflexes 2+ throughout, toes downgoing.  Finger to nose and heel to shin testing intact.  Gait normal, Romberg negative.     Metta Clines, DO  CC: Seward Carol, MD

## 2021-01-15 ENCOUNTER — Ambulatory Visit: Payer: Medicare HMO | Admitting: Neurology

## 2021-01-15 DIAGNOSIS — M7592 Shoulder lesion, unspecified, left shoulder: Secondary | ICD-10-CM | POA: Diagnosis not present

## 2021-01-15 DIAGNOSIS — S43015D Anterior dislocation of left humerus, subsequent encounter: Secondary | ICD-10-CM | POA: Diagnosis not present

## 2021-01-17 DIAGNOSIS — M7592 Shoulder lesion, unspecified, left shoulder: Secondary | ICD-10-CM | POA: Diagnosis not present

## 2021-01-17 DIAGNOSIS — S43015D Anterior dislocation of left humerus, subsequent encounter: Secondary | ICD-10-CM | POA: Diagnosis not present

## 2021-01-21 DIAGNOSIS — S43015D Anterior dislocation of left humerus, subsequent encounter: Secondary | ICD-10-CM | POA: Diagnosis not present

## 2021-01-21 DIAGNOSIS — M7592 Shoulder lesion, unspecified, left shoulder: Secondary | ICD-10-CM | POA: Diagnosis not present

## 2021-01-23 DIAGNOSIS — S43015D Anterior dislocation of left humerus, subsequent encounter: Secondary | ICD-10-CM | POA: Diagnosis not present

## 2021-01-23 DIAGNOSIS — M7592 Shoulder lesion, unspecified, left shoulder: Secondary | ICD-10-CM | POA: Diagnosis not present

## 2021-01-28 DIAGNOSIS — S43015D Anterior dislocation of left humerus, subsequent encounter: Secondary | ICD-10-CM | POA: Diagnosis not present

## 2021-01-28 DIAGNOSIS — M7592 Shoulder lesion, unspecified, left shoulder: Secondary | ICD-10-CM | POA: Diagnosis not present

## 2021-01-30 DIAGNOSIS — M7592 Shoulder lesion, unspecified, left shoulder: Secondary | ICD-10-CM | POA: Diagnosis not present

## 2021-01-30 DIAGNOSIS — S43015D Anterior dislocation of left humerus, subsequent encounter: Secondary | ICD-10-CM | POA: Diagnosis not present

## 2021-02-04 DIAGNOSIS — M7592 Shoulder lesion, unspecified, left shoulder: Secondary | ICD-10-CM | POA: Diagnosis not present

## 2021-02-04 DIAGNOSIS — S43015D Anterior dislocation of left humerus, subsequent encounter: Secondary | ICD-10-CM | POA: Diagnosis not present

## 2021-02-06 DIAGNOSIS — M7592 Shoulder lesion, unspecified, left shoulder: Secondary | ICD-10-CM | POA: Diagnosis not present

## 2021-02-06 DIAGNOSIS — S43015D Anterior dislocation of left humerus, subsequent encounter: Secondary | ICD-10-CM | POA: Diagnosis not present

## 2021-02-10 DIAGNOSIS — N529 Male erectile dysfunction, unspecified: Secondary | ICD-10-CM | POA: Diagnosis not present

## 2021-02-10 DIAGNOSIS — Z809 Family history of malignant neoplasm, unspecified: Secondary | ICD-10-CM | POA: Diagnosis not present

## 2021-02-10 DIAGNOSIS — Z8249 Family history of ischemic heart disease and other diseases of the circulatory system: Secondary | ICD-10-CM | POA: Diagnosis not present

## 2021-02-10 DIAGNOSIS — R030 Elevated blood-pressure reading, without diagnosis of hypertension: Secondary | ICD-10-CM | POA: Diagnosis not present

## 2021-02-10 DIAGNOSIS — Z720 Tobacco use: Secondary | ICD-10-CM | POA: Diagnosis not present

## 2021-02-10 DIAGNOSIS — Z85828 Personal history of other malignant neoplasm of skin: Secondary | ICD-10-CM | POA: Diagnosis not present

## 2021-02-10 DIAGNOSIS — Z823 Family history of stroke: Secondary | ICD-10-CM | POA: Diagnosis not present

## 2021-02-24 DIAGNOSIS — S43015D Anterior dislocation of left humerus, subsequent encounter: Secondary | ICD-10-CM | POA: Diagnosis not present

## 2021-02-24 DIAGNOSIS — M7592 Shoulder lesion, unspecified, left shoulder: Secondary | ICD-10-CM | POA: Diagnosis not present

## 2021-02-27 DIAGNOSIS — D485 Neoplasm of uncertain behavior of skin: Secondary | ICD-10-CM | POA: Diagnosis not present

## 2021-02-27 DIAGNOSIS — L820 Inflamed seborrheic keratosis: Secondary | ICD-10-CM | POA: Diagnosis not present

## 2021-02-27 DIAGNOSIS — L570 Actinic keratosis: Secondary | ICD-10-CM | POA: Diagnosis not present

## 2021-02-27 DIAGNOSIS — L905 Scar conditions and fibrosis of skin: Secondary | ICD-10-CM | POA: Diagnosis not present

## 2021-02-27 DIAGNOSIS — L812 Freckles: Secondary | ICD-10-CM | POA: Diagnosis not present

## 2021-02-27 DIAGNOSIS — D1801 Hemangioma of skin and subcutaneous tissue: Secondary | ICD-10-CM | POA: Diagnosis not present

## 2021-02-27 DIAGNOSIS — L821 Other seborrheic keratosis: Secondary | ICD-10-CM | POA: Diagnosis not present

## 2021-02-27 DIAGNOSIS — L814 Other melanin hyperpigmentation: Secondary | ICD-10-CM | POA: Diagnosis not present

## 2021-02-27 DIAGNOSIS — I8393 Asymptomatic varicose veins of bilateral lower extremities: Secondary | ICD-10-CM | POA: Diagnosis not present

## 2021-02-27 DIAGNOSIS — L819 Disorder of pigmentation, unspecified: Secondary | ICD-10-CM | POA: Diagnosis not present

## 2021-02-27 DIAGNOSIS — D229 Melanocytic nevi, unspecified: Secondary | ICD-10-CM | POA: Diagnosis not present

## 2021-05-20 DIAGNOSIS — H5203 Hypermetropia, bilateral: Secondary | ICD-10-CM | POA: Diagnosis not present

## 2021-06-11 DIAGNOSIS — J0100 Acute maxillary sinusitis, unspecified: Secondary | ICD-10-CM | POA: Diagnosis not present

## 2021-07-17 DIAGNOSIS — Z131 Encounter for screening for diabetes mellitus: Secondary | ICD-10-CM | POA: Diagnosis not present

## 2021-07-17 DIAGNOSIS — Z5181 Encounter for therapeutic drug level monitoring: Secondary | ICD-10-CM | POA: Diagnosis not present

## 2021-07-17 DIAGNOSIS — H6123 Impacted cerumen, bilateral: Secondary | ICD-10-CM | POA: Diagnosis not present

## 2021-07-17 DIAGNOSIS — Z0000 Encounter for general adult medical examination without abnormal findings: Secondary | ICD-10-CM | POA: Diagnosis not present

## 2021-07-17 DIAGNOSIS — Z79899 Other long term (current) drug therapy: Secondary | ICD-10-CM | POA: Diagnosis not present

## 2021-07-17 DIAGNOSIS — Z125 Encounter for screening for malignant neoplasm of prostate: Secondary | ICD-10-CM | POA: Diagnosis not present

## 2021-09-16 DIAGNOSIS — R972 Elevated prostate specific antigen [PSA]: Secondary | ICD-10-CM | POA: Diagnosis not present

## 2021-09-16 DIAGNOSIS — N5201 Erectile dysfunction due to arterial insufficiency: Secondary | ICD-10-CM | POA: Diagnosis not present

## 2021-09-18 ENCOUNTER — Other Ambulatory Visit: Payer: Self-pay | Admitting: Urology

## 2021-09-18 DIAGNOSIS — R972 Elevated prostate specific antigen [PSA]: Secondary | ICD-10-CM

## 2021-09-23 DIAGNOSIS — L821 Other seborrheic keratosis: Secondary | ICD-10-CM | POA: Diagnosis not present

## 2021-09-23 DIAGNOSIS — L814 Other melanin hyperpigmentation: Secondary | ICD-10-CM | POA: Diagnosis not present

## 2021-09-23 DIAGNOSIS — L570 Actinic keratosis: Secondary | ICD-10-CM | POA: Diagnosis not present

## 2021-09-23 DIAGNOSIS — C4442 Squamous cell carcinoma of skin of scalp and neck: Secondary | ICD-10-CM | POA: Diagnosis not present

## 2021-09-23 DIAGNOSIS — Z08 Encounter for follow-up examination after completed treatment for malignant neoplasm: Secondary | ICD-10-CM | POA: Diagnosis not present

## 2021-09-23 DIAGNOSIS — D225 Melanocytic nevi of trunk: Secondary | ICD-10-CM | POA: Diagnosis not present

## 2021-09-23 DIAGNOSIS — Z85828 Personal history of other malignant neoplasm of skin: Secondary | ICD-10-CM | POA: Diagnosis not present

## 2021-10-07 ENCOUNTER — Other Ambulatory Visit: Payer: Self-pay

## 2021-10-07 ENCOUNTER — Ambulatory Visit
Admission: RE | Admit: 2021-10-07 | Discharge: 2021-10-07 | Disposition: A | Discharge status: Home / Self Care | Payer: Medicare HMO | Source: Ambulatory Visit | Attending: Urology | Admitting: Urology

## 2021-10-07 DIAGNOSIS — R972 Elevated prostate specific antigen [PSA]: Secondary | ICD-10-CM | POA: Diagnosis not present

## 2021-10-07 DIAGNOSIS — K5730 Diverticulosis of large intestine without perforation or abscess without bleeding: Secondary | ICD-10-CM | POA: Diagnosis not present

## 2021-10-07 DIAGNOSIS — N3289 Other specified disorders of bladder: Secondary | ICD-10-CM | POA: Diagnosis not present

## 2021-10-07 MED ORDER — GADOBENATE DIMEGLUMINE 529 MG/ML IV SOLN
17.0000 mL | Freq: Once | INTRAVENOUS | Status: AC | PRN
  Administered 2021-10-07: 17 mL via INTRAVENOUS

## 2021-11-14 DIAGNOSIS — N5201 Erectile dysfunction due to arterial insufficiency: Secondary | ICD-10-CM | POA: Diagnosis not present

## 2021-11-14 DIAGNOSIS — R972 Elevated prostate specific antigen [PSA]: Secondary | ICD-10-CM | POA: Diagnosis not present

## 2022-01-27 DIAGNOSIS — L578 Other skin changes due to chronic exposure to nonionizing radiation: Secondary | ICD-10-CM | POA: Diagnosis not present

## 2022-01-27 DIAGNOSIS — D492 Neoplasm of unspecified behavior of bone, soft tissue, and skin: Secondary | ICD-10-CM | POA: Diagnosis not present

## 2022-01-27 DIAGNOSIS — L570 Actinic keratosis: Secondary | ICD-10-CM | POA: Diagnosis not present

## 2022-04-16 DIAGNOSIS — L570 Actinic keratosis: Secondary | ICD-10-CM | POA: Diagnosis not present

## 2022-06-29 DIAGNOSIS — H5213 Myopia, bilateral: Secondary | ICD-10-CM | POA: Diagnosis not present

## 2023-03-16 IMAGING — MR MR PROSTATE WO/W CM
12 series · 48 of 48 positions shown · IV contrast (multihance)
Comparison: None.

CLINICAL DATA: Elevated PSA.

EXAM:
MR PROSTATE WITHOUT AND WITH CONTRAST
TECHNIQUE: Multiplanar multisequence MRI images were obtained of the pelvis
centered about the prostate. Pre and post contrast images were
obtained.
CONTRAST:  17mL MULTIHANCE GADOBENATE DIMEGLUMINE 529 MG/ML IV SOLN

[Series 3: T2 · coronal · 3.0mm · 0.56mm/px · 1 of 23 slices shown (1 of 3)]
[im 1/23]
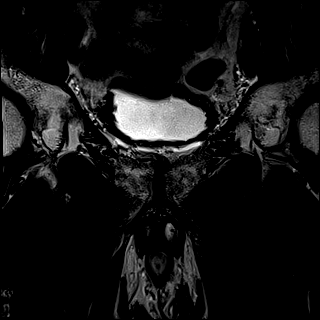

[Series 4: T1 · axial · 5.0mm · 1.25mm/px · z∈[-81,+134]mm · 2 of 88 slices shown]
[im 1/88]
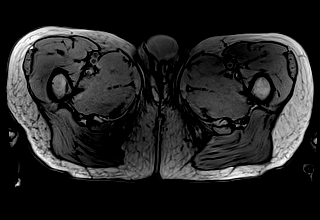
[im 88/88]
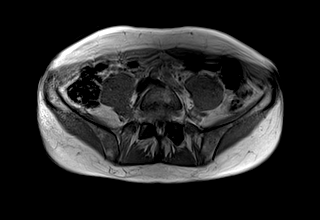

[Series 5: DWI · axial · 3.0mm · 1.75mm/px · z∈[-45,+33]mm · 2 of 81 slices shown (1 of 3)]
[im 1/81]
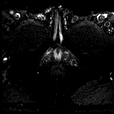
[im 81/81]
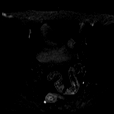

[Series 6: DWI · axial · 3.0mm · 1.75mm/px · 1 of 27 slices shown (2 of 3)]
[im 1/27]
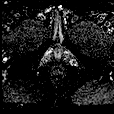

[Series 7: DWI · axial · 3.0mm · 1.75mm/px · 1 of 27 slices shown (3 of 3)]
[im 1/27]
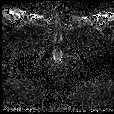

[Series 8: T2 · axial · 3.0mm · 0.56mm/px · 1 of 26 slices shown (2 of 3)]
[im 1/26]
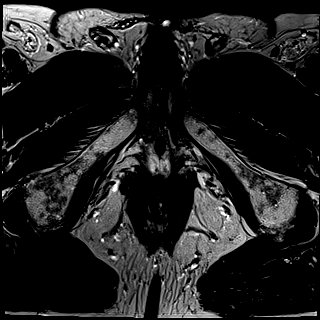

[Series 9: T2 · axial · 1.0mm · 1.04mm/px · z∈[-40,+40]mm · 2 of 80 slices shown (3 of 3)]
[im 1/80]
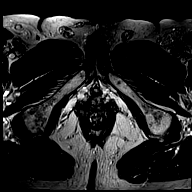
[im 80/80]
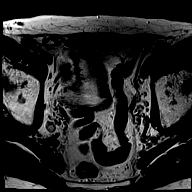

[Series 10: pre t1_twist_tra_dyn · axial · non-contrast · 3.5mm · 0.83mm/px · 1 of 20 slices shown]
[im 1/20]
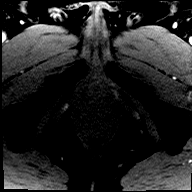

[Series 11: post t1_twist_tra_dyn-copy center · axial · non-contrast · 3.5mm · 0.83mm/px · z∈[-39,+27]mm · 17 of 600 slices shown]
[im 1/600]
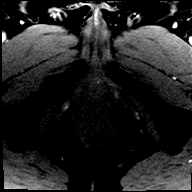
[im 38/600]
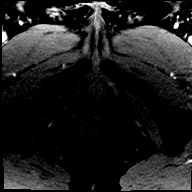
[im 75/600]
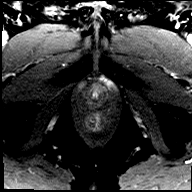
[im 113/600]
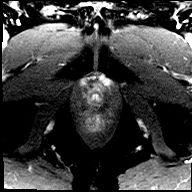
[im 150/600]
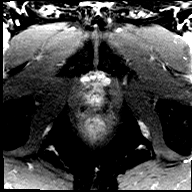
[im 188/600]
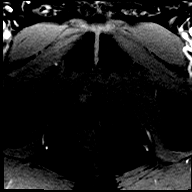
[im 225/600]
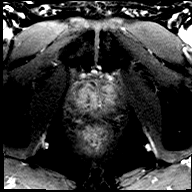
[im 263/600]
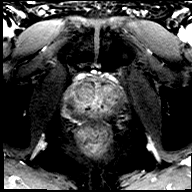
[im 300/600]
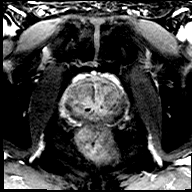
[im 337/600]
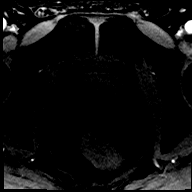
[im 375/600]
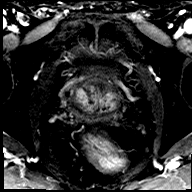
[im 412/600]
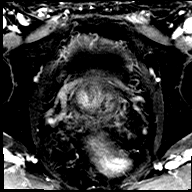
[im 450/600]
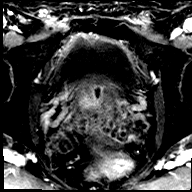
[im 487/600]
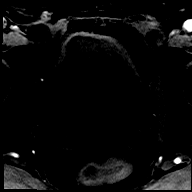
[im 525/600]
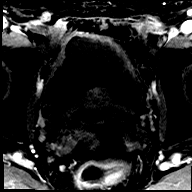
[im 562/600]
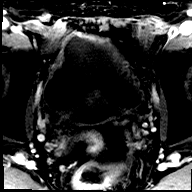
[im 600/600]
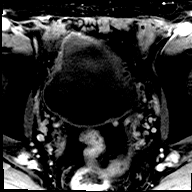

[Series 12: post t1_twist_tra_dyn-copy cent_sub · axial · 3.5mm · 0.83mm/px · z∈[-39,+27]mm · 16 of 578 slices shown]
[im 1/578]
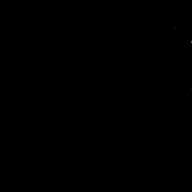
[im 39/578]
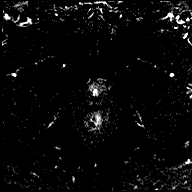
[im 77/578]
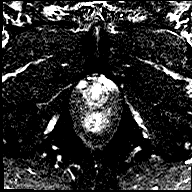
[im 116/578]
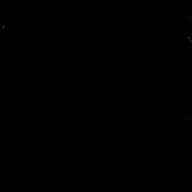
[im 154/578]
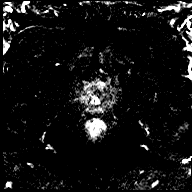
[im 193/578]
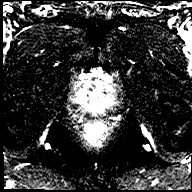
[im 231/578]
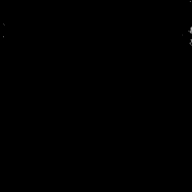
[im 270/578]
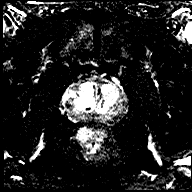
[im 308/578]
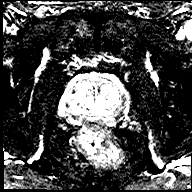
[im 347/578]
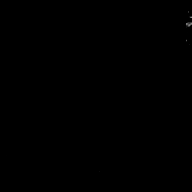
[im 385/578]
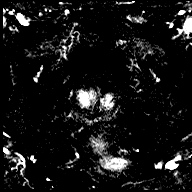
[im 424/578]
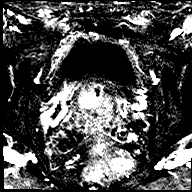
[im 462/578]
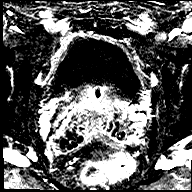
[im 501/578]
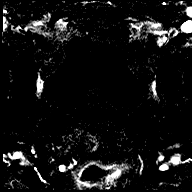
[im 539/578]
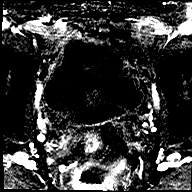
[im 578/578]
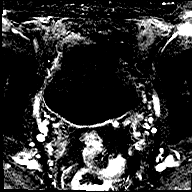

[Series 13: t1_vibe_dixon_tra_f · axial · 2.5mm · 0.91mm/px · z∈[-72,+125]mm · 2 of 80 slices shown]
[im 1/80]
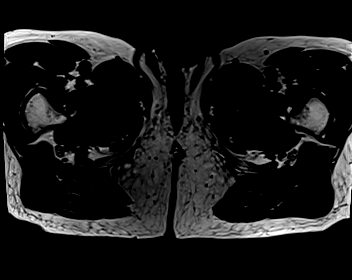
[im 80/80]
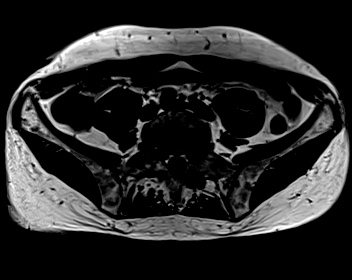

[Series 14: t1_vibe_dixon_tra_w · axial · 2.5mm · 0.91mm/px · z∈[-72,+125]mm · 2 of 80 slices shown]
[im 1/80]
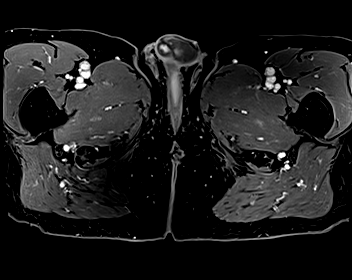
[im 80/80]
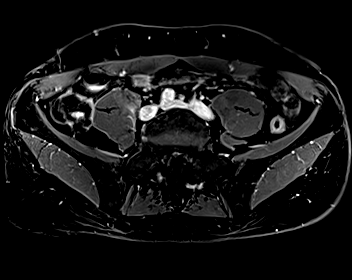

[48 of 48 positions shown; findings below may reference images not displayed]

FINDINGS: Prostate:

-- Peripheral Zone: Linear/wedge shaped hypointensities are noted on
ADC; however, no focal ADC hypointense or high b-value DWI
hyperintense nodules are identified.

-- Transition/Central Zone: Mildly enlarged with encapsulated BPH
nodules noted; however, no suspicious nodules are identified on
T2-weighted or diffusion sequences.

-- Measurements/Volume:  5.3 by 3.9 x 5.5 cm (volume = 60 cm^3)

Transcapsular spread:  Absent

Seminal vesicle involvement:  Absent

Neurovascular bundle involvement:  Absent

Pelvic adenopathy: None visualized

Bone metastasis: None visualized

Other: Diffuse bladder wall thickening, consistent with chronic
bladder outlet obstruction. Sigmoid diverticulosis noted, without
evidence of diverticulitis.
IMPRESSION: No radiographic evidence of high-grade prostate carcinoma. PI-RADS 2
(v.2.1): Low (clinically significant cancer unlikely)
